# Patient Record
Sex: Female | Born: 1960 | Race: Black or African American | Hispanic: No | Smoking: Never smoker
Health system: Southern US, Community
[De-identification: ages and names within clinical notes are randomized; demographics above are authoritative.]

## PROBLEM LIST (undated history)

## (undated) DIAGNOSIS — C801 Malignant (primary) neoplasm, unspecified: Secondary | ICD-10-CM

---

## 2007-07-29 ENCOUNTER — Ambulatory Visit (HOSPITAL_COMMUNITY): Admission: RE | Admit: 2007-07-29 | Discharge: 2007-07-29 | Payer: Self-pay | Admitting: Obstetrics and Gynecology

## 2007-07-29 ENCOUNTER — Encounter (INDEPENDENT_AMBULATORY_CARE_PROVIDER_SITE_OTHER): Payer: Self-pay | Admitting: Obstetrics and Gynecology

## 2010-08-19 NOTE — H&P (Signed)
Emma Montoya                 ACCOUNT NO.:  [id number]   MEDICAL RECORD NO.:  [id number]          PATIENT TYPE:  AMB   LOCATION:  DAY                          FACILITY:  [facility]   PHYSICIAN:  Crist Fat. Rivard, M.D. DATE OF BIRTH:  16-Mar-1961   DATE OF ADMISSION:  DATE OF DISCHARGE:                              HISTORY & PHYSICAL   HISTORY OF PRESENT ILLNESS:  [name] is a 50 year old divorced  African American female, para 0-0-1-0, who presents for a robot assisted  supracervical hysterectomy because of dysfunctional uterine bleeding and  symptomatic uterine fibroids.  For the past 5 years, the patient has  complained of heavy vaginal bleeding with symptoms worsening over the  past 6 months.  The patient gives the account of a 14-day menstrual flow  with moderate size clotting that require her to change a tampon with a  pad every 2 hours.  The patient originally was placed on Seasonique,  which decreased the frequency of her menses to every 90 days, however  the length of her flow remained the same.  She was then tried on Femcon  oral contraceptives, which did shorten the length of her flow to 7 days.  However, the amount of her flow remained the same.   She admits to nocturia, dyspareunia, but denies changes in her bowel  movements, dysuria, vaginitis symptoms or cramping.   A pelvic ultrasound in March 2009 showed a uterus measuring 10.18 x 8.89  x 7.08 cm with 6 fibroids which are as follows:  Anterior pedunculated  4.24 cm, fundal 3.81 cm, anterior 1.93 cm, two posterior fibroids 3.37  cm and 3.18 cm respectively, and an anterior submucosal fibroid  measuring 2.11 cm.  Additionally a hyperechoic mass was observed within  the endometrium measuring 1.46 x 1.12 cm.  The patient's ovaries  appeared within normal range on this study.  An endometrial biopsy  revealed an inactive endometrium with no hyperplasia or atypia.   After review of both medical and surgical management  options was given  to the patient, she has decided to proceed with definitive therapy due  to the chronicity and disruptive nature of her symptoms.  The patient  has consented for a supracervical hysterectomy.   OB HISTORY:  Gravida 1, para 0-0-1-0.   GYN HISTORY:  Menarche 50 years old.  The patient's last menstrual  period was June 27, 2007.  She denies any history of sexually  transmitted diseases or abnormal Pap smears.  Her last normal Pap smear  was January 2009, as was her last normal mammogram.  The patient uses  bilateral tubal ligation as her method of contraception.   MEDICAL HISTORY:  Positive for anemia.   SURGICAL HISTORY:  In 2003, bilateral tubal ligation.  Denies any  history of blood transfusions or problems with anesthesia.   FAMILY HISTORY:  Cardiovascular disease, thyroid disease, hypertension,  diabetes mellitus and stroke.   SOCIAL HISTORY:  The patient is divorced, and she works for [employer].  Habits:  She does not use tobacco.  She occasionally consumes alcohol.  Denies any illicit drug use.  CURRENT MEDICATIONS:  Femcon, 1 tablet daily, iron 1 tablet daily.   ALLERGIES:  She has no known drug allergies.   REVIEW OF SYSTEMS:  The patient does not wear any corrective lenses.  She does have a right upper jaw partial plate.  Denies any chest pain,  shortness of breath, fever, weight loss, headache, vision changes,  nausea, vomiting, diarrhea, pelvic pain and except as is mentioned in  history of present illness, the patient's review of systems is negative.   PHYSICAL EXAMINATION:  VITAL SIGNS:  Blood pressure 120/80.  Weight is  165.  Height is 5 feet 4 inches tall.  Pulse is 64, temperature 97.7  degrees Fahrenheit orally.  NECK:  Supple without masses.  There is no thyromegaly or cervical  adenopathy.  HEART:  Regular rate and rhythm.  LUNGS:  Clear.  BACK:  No CVA tenderness.  ABDOMEN:  No tenderness, masses or organomegaly.  EXTREMITIES:  No  clubbing, cyanosis or edema.  PELVIC EXAM:  EDBUS was within normal limits.  Vagina was rugous.  Cervix was nontender without lesions.  Uterus appears 12-14 weeks' size  with a palpable posterior fibroid approximately 4 cm.  It was mobile.  There was no tenderness.  Adnexa without tenderness or masses.  Rectovaginal without any tenderness or masses.   IMPRESSION:  1. Dysfunctional uterine bleeding.  2. Symptomatic uterine fibroids.   DISPOSITION:  A discussion was held with the patient regarding the  indications for her procedure along with its risks which include but are  not limited to reaction to anesthesia, damage to adjacent organs,  infection, excessive bleeding, the possibility of spotting due to the  retention of her cervix, and the possibility of growth of new fibroids  in her cervical region (though this is rare).  The patient further  understands that the length of her procedure is longer than it would be  through a traditional open abdominal incision, that she is expected to  be in the hospital for 1-2 days, that she should be expected to return  to her usual activities within 2-3 weeks.  The patient has verbalized  understanding of all of these risks and considerations and has consented  to proceed with a supracervical robot assisted hysterectomy at [facility] on July 29, 2007 at 7:30 a.m.      Emma Montoya.      Crist Fat Rivard, M.D.  Electronically Signed    EJP/MEDQ  D:  07/20/2007  T:  07/20/2007  Job:  [id number]

## 2010-08-19 NOTE — Op Note (Signed)
NAMEROSALIA, MCAVOY                 ACCOUNT NO.:  [id number]   MEDICAL RECORD NO.:  [id number]          PATIENT TYPE:  AMB   LOCATION:  DAY                          FACILITY:  [facility]   PHYSICIAN:  Crist Fat. Rivard, M.D. DATE OF BIRTH:  29-Jul-1960   DATE OF PROCEDURE:  07/29/2007  DATE OF DISCHARGE:                               OPERATIVE REPORT   PREOPERATIVE DIAGNOSIS:  Dysfunctional uterine bleeding with symptomatic  uterine fibroids.   POSTOPERATIVE DIAGNOSIS:  Dysfunctional uterine bleeding with  symptomatic uterine fibroids.   ANESTHESIA:  General, Dr. Okey Dupre.   PROCEDURE:  Robotic assisted supracervical hysterectomy.   SURGEON:  Crist Fat. Rivard, M.D.   ASSISTANTMarquis Lunch. Lowell Guitar, physician's assistant.   ESTIMATED BLOOD LOSS:  100 mL.   DESCRIPTION OF PROCEDURE:  After being informed of the planned procedure  with possible complications including bleeding, infection, injury to  bowel, bladder or ureters, need for laparotomy as well as the  possibility of postoperative bleeding or postoperative growth of a  cervical fibroid, informed consent is obtained.  The patient is taken to  OR #10, given general anesthesia with endotracheal intubation without  complication.  She is placed in the lithotomy position on the sticky  mattress with knee-high sequential compressive devices, arms padded and  tucked on each side and shoulder restraints were placed half an inch  above the shoulders to prevent movement and those shoulder restraints  were double padded.  Her chest is then taped to the table.   She is prepped and draped in a sterile fashion and a Foley catheter is  inserted in her bladder.  Pelvic exam reveals an enlarged uterus  approximately 14 weeks in size with a dominant posterior fibroid,  mobile.  A weighted speculum is inserted, anterior lip of the cervix is  grasped with a tenaculum forceps and the uterus is sounded at 11.5 cm.  The cervix is easily dilated using  Hegar dilator until a #10 RUMI  intrauterine manipulator can be inserted easily.  This RUMI intrauterine  manipulator is also fitted with a 3.5 Coring and a vaginal occluder.  The balloon is inflated with 5 mL of saline and the Coring is sutured to  the cervix with 0 Vicryl.   We then proceed with measuring our trocar placement above the fundus of  the uterus and infiltrate the supraumbilical area with 5 mL of Marcaine  0.25.  A 1 cm vertical incision is then performed and brought down  bluntly to the fascia which is identified and grasped with Kocher  forceps and incised with Mayo scissors.  The peritoneum is then entered  bluntly.  A pursestring suture of 0 Vicryl is placed on the fascia and a  10 mm Hasson trocar is inserted easily in the abdominal cavity.  This  trocar is held in place with a pursestring suture.  It now allows [place] to  insufflate a pneumoperitoneum with CO2 at a maximum pressure of 15 mmHg  and we insert the camera to do a first evaluation of the pelvis.  We see  a normal anterior and  posterior cul-de-sac, a bulky uterus with multiple  fibroids predominantly posterior, but very mobile.  Both ovaries are  normal and both tubes have previous tubal ligation with placement of  four Filshie clips on the right tube and three Filshie clips on the left  tube.  After completing insufflation, we decide the placement of the  remaining trocars and we placed two 8 mm robotic trocars on the left  side under direct visualization after infiltrating each side with  Marcaine 0.25 and we place one 8 mm robotic trocar on the right side in  the same fashion.  We finally place a 10 mm patient side assistant  trocar on the right side after infiltrating with Marcaine 0.25.  The  robot is then advanced in its dock and docking is completed at 8:55 a.m.   Using a Cobra grasper in arm #3, a gyrus forceps in arm #2 and a  monopolar sharp scissor in arm #1 we then proceed with our planned   supracervical hysterectomy.   Starting on the right side we decide to excise the four Filshie clips  using bipolar cauterization and monopolar resection.  We remove this  segment of the tube with the Filshie clips attached and this is removed  via the 10 mm trocar.  Now we have access to the tubo-ovarian ligament  on the right side which is cauterized and sectioned, the round ligament  which is cauterized and sectioned and this gives [place] easy entry to the  broad ligament anteriorly and posteriorly.  Anteriorly this is sharply  dissected and the bladder is completely dissected away easily from the  vaginal fornix identified with the Coring.  We can then skeletonize the  uterine vessels and identify them in the ascending branch near the  Coring junction.  These vessels are then cauterized with bipolar  cauterization.   We proceed in the same fashion on the left side and again we proceed  with excision of the three Filshie clips with cauterization and excision  and removal of the specimen through the 10 mm trocar.  The utero-ovarian  ligament is cauterized and sectioned, the round ligament is cauterized  and sectioned and the broad ligament is sharply entered finishing  completing our dissection of the bladder and skeletonizing the uterine  vessels on the left.   These are then grasped in the ascending branch near the junction of the  Coring and cauterized.  Of course during cauterization of uterine  vessels we can easily identify both ureters which are away from our site  of cauterization.  Now with the posterior Coring in view, we decide to  proceed with the supracervical hysterectomy.  Below the lower uterine  segment we used the monopolar scissors and start our posterior approach  to the cervix.  This is done in a circumferential way easily identifying  the RUMI intrauterine manipulator.  The uterus is completely detached  from its cervical stump.  Hemostasis is completed with  cauterization.  We then irrigate profusely to note a satisfactory hemostasis as well as  two normal ureters with no dilatation and good peristalsis.  The robot  is undocked and the robotic instruments are removed.  We then remove the  10 mm patient side assistant trocar, extend our incision to a 15 mm  incision and insert under direct visualization the morcellator.  We then  proceed with systematic morcellation of the specimen which takes  approximately 45 minutes.  We again turn our attention to the cervical  stump and to all our pedicles which reveal a satisfactory hemostasis.  We moved to a vaginal time to remove the vaginal tenaculum, cervical  tenaculum.  The section the previously placed sutures on the Coring.  We  deflate the RUMI intrauterine manipulator and we remove everything en  bloc.  An oozing site on the anterior lip of the cervix is controlled  with figure-of-eight stitch of 0 Vicryl.  We can now return to  laparoscopy time and using both monopolar and bipolar energy we  cauterize systematically the endocervical canal to avoid postoperative  bleeding.   We irrigate profusely and notice satisfactory hemostasis.  All trocars  are then removed under direct visualization after evacuating the  pneumoperitoneum.   The fascia of the supraumbilical incision is closed with the previously  placed pursestring suture of 0 Vicryl.  We then close the fascia of the  15 mm trocar with a figure-of-eight stitch of 0 Vicryl and all incision  skin is then closed with subcuticular suture of 3-0 Monocryl and  Dermabond.   Instrument and sponge count is complete x2.  Estimated blood loss is 100  mL.  The procedure is very well tolerated by the patient who is taken to  recovery room in a well and stable condition.   SPECIMEN:  Uterine body weighing 278 grams was sent to pathology as well  as seven Filshie clips on two tubal segments.      Crist Fat Rivard, M.D.  Electronically  Signed     SAR/MEDQ  D:  07/29/2007  T:  07/29/2007  Job:  [id number]

## 2010-12-30 LAB — PREGNANCY, URINE: Preg Test, Ur: NEGATIVE

## 2010-12-30 LAB — BASIC METABOLIC PANEL
GFR calc Af Amer: >60
GFR calc non Af Amer: >60
Potassium: 4.1
Sodium: 135

## 2010-12-30 LAB — CBC
HCT: 39.4
Hemoglobin: 13.2
RBC: 4.38
WBC: 9.4

## 2011-06-25 ENCOUNTER — Ambulatory Visit (INDEPENDENT_AMBULATORY_CARE_PROVIDER_SITE_OTHER): Payer: PRIVATE HEALTH INSURANCE | Admitting: Obstetrics and Gynecology

## 2011-06-25 DIAGNOSIS — Z01419 Encounter for gynecological examination (general) (routine) without abnormal findings: Secondary | ICD-10-CM

## 2018-10-12 ENCOUNTER — Other Ambulatory Visit: Payer: Self-pay | Admitting: Registered Nurse

## 2018-10-12 DIAGNOSIS — N6310 Unspecified lump in the right breast, unspecified quadrant: Secondary | ICD-10-CM

## 2018-10-19 ENCOUNTER — Ambulatory Visit
Admission: RE | Admit: 2018-10-19 | Discharge: 2018-10-19 | Disposition: A | Discharge status: Home / Self Care | Payer: BLUE CROSS/BLUE SHIELD | Source: Ambulatory Visit | Attending: Registered Nurse | Admitting: Registered Nurse

## 2018-10-19 ENCOUNTER — Other Ambulatory Visit: Payer: Self-pay

## 2018-10-19 DIAGNOSIS — N6310 Unspecified lump in the right breast, unspecified quadrant: Secondary | ICD-10-CM

## 2018-10-26 ENCOUNTER — Encounter: Payer: Self-pay | Admitting: Adult Health

## 2018-10-26 DIAGNOSIS — Z170 Estrogen receptor positive status [ER+]: Secondary | ICD-10-CM | POA: Insufficient documentation

## 2018-10-26 DIAGNOSIS — C50411 Malignant neoplasm of upper-outer quadrant of right female breast: Secondary | ICD-10-CM | POA: Insufficient documentation

## 2018-10-27 ENCOUNTER — Ambulatory Visit
Admission: RE | Admit: 2018-10-27 | Discharge: 2018-10-27 | Disposition: A | Discharge status: Home / Self Care | Payer: BLUE CROSS/BLUE SHIELD | Source: Ambulatory Visit | Attending: Surgery | Admitting: Surgery

## 2018-10-27 ENCOUNTER — Other Ambulatory Visit: Payer: Self-pay | Admitting: Surgery

## 2018-10-27 ENCOUNTER — Other Ambulatory Visit: Payer: Self-pay

## 2018-10-27 ENCOUNTER — Other Ambulatory Visit (HOSPITAL_COMMUNITY): Payer: Self-pay | Admitting: Surgery

## 2018-10-27 DIAGNOSIS — C50911 Malignant neoplasm of unspecified site of right female breast: Secondary | ICD-10-CM

## 2018-10-28 ENCOUNTER — Other Ambulatory Visit (HOSPITAL_COMMUNITY): Payer: Self-pay | Admitting: Surgery

## 2018-10-28 DIAGNOSIS — C50911 Malignant neoplasm of unspecified site of right female breast: Secondary | ICD-10-CM

## 2018-10-28 DIAGNOSIS — Z170 Estrogen receptor positive status [ER+]: Secondary | ICD-10-CM

## 2018-10-31 ENCOUNTER — Encounter: Payer: Self-pay | Admitting: *Deleted

## 2018-11-21 ENCOUNTER — Encounter (HOSPITAL_COMMUNITY)
Admission: RE | Admit: 2018-11-21 | Discharge: 2018-11-21 | Disposition: A | Discharge status: Home / Self Care | Payer: BLUE CROSS/BLUE SHIELD | Source: Ambulatory Visit | Attending: Surgery | Admitting: Surgery

## 2018-11-21 ENCOUNTER — Encounter (HOSPITAL_COMMUNITY): Payer: Self-pay

## 2018-11-21 ENCOUNTER — Other Ambulatory Visit: Payer: Self-pay

## 2018-11-21 DIAGNOSIS — Z01812 Encounter for preprocedural laboratory examination: Secondary | ICD-10-CM | POA: Insufficient documentation

## 2018-11-21 HISTORY — DX: Malignant (primary) neoplasm, unspecified: C80.1

## 2018-11-21 LAB — CBC
HCT: 38.5 % (ref 36.0–46.0)
Hemoglobin: 12.7 g/dL (ref 12.0–15.0)
MCH: 29.4 pg (ref 26.0–34.0)
MCHC: 33 g/dL (ref 30.0–36.0)
MCV: 89.1 fL (ref 80.0–100.0)
Platelets: 219 10*3/uL (ref 150–400)
RBC: 4.32 MIL/uL (ref 3.87–5.11)
RDW: 12.2 % (ref 11.5–15.5)
WBC: 7.7 10*3/uL (ref 4.0–10.5)
nRBC: 0 % (ref 0.0–0.2)

## 2018-11-21 NOTE — Progress Notes (Addendum)
PCP - NO PCP, Uses her GYN as PCP-Sandra Rivard, MD Cardiologist - pt denies  Chest x-ray - pt denies EKG - pt denies  Stress Test - pt denies ECHO - pt denies  Cardiac Cath - pt denies  Sleep Study - n/a CPAP - n/a  Fasting Blood Sugar - n/a Checks Blood Sugar _____ times a day-n/a  Blood Thinner Instructions: n/a Aspirin Instructions:n/a  Anesthesia review: No  Patient denies shortness of breath, fever, cough and chest pain at PAT appointment  Patient verbalized understanding of instructions that were given to them at the PAT appointment. Patient was also instructed that they will need to review over the PAT instructions again at home before surgery.   Coronavirus Screening  Have you experienced the following symptoms:  Cough yes/no: No Fever (>100.40F)  yes/no: No Runny nose yes/no: No Sore throat yes/no: No Difficulty breathing/shortness of breath  yes/no: No  Have you or a family member traveled in the last 14 days and where? yes/no: No   If the patient indicates "YES" to the above questions, their PAT will be rescheduled to limit the exposure to others and, the surgeon will be notified. THE PATIENT WILL NEED TO BE ASYMPTOMATIC FOR 14 DAYS.   If the patient is not experiencing any of these symptoms, the PAT nurse will instruct them to NOT bring anyone with them to their appointment since they may have these symptoms or traveled as well.   Please remind your patients and families that hospital visitation restrictions are in effect and the importance of the restrictions.

## 2018-11-21 NOTE — Pre-Procedure Instructions (Signed)
Emma Montoya  11/21/2018      [id number] [place], [place] - [address] [place] [address] Phone: [phone] Fax: [phone]    Your procedure is scheduled on Friday November 25, 2018.  Report to [facility] Admitting at 1230 PM.  Call this number if you have problems the morning of surgery:  [phone]   Remember:  Do not eat after midnight (Thursday).  You may drink clear liquids until 1130 AM the morning of surgery.  Clear liquids allowed are:  Water, Juice (non-citric and without pulp), Clear Tea, Black Coffee only and Gatorade    Take these medicines the morning of surgery with A SIP OF WATER -None  Beginning now, STOP taking any Aspirin (unless otherwise instructed by your surgeon), Aleve, Naproxen, Ibuprofen, Motrin, Advil, Goody's, BC's, all herbal medications, fish oil, and all vitamins   Day of surgery:  Do not wear jewelry, make-up or nail polish.  Do not wear lotions, powders, or perfumes, or deodorant.  Do not shave 48 hours prior to surgery.    Do not bring valuables to the hospital.  [facility] is not responsible for any belongings or valuables.  IF you are a smoker, DO NOT Smoke 24 hours prior to surgery   IF you wear a CPAP at night please bring your mask, tubing, and machine the morning of surgery    Remember that you must have someone to transport you home after your surgery, and remain with you for 24 hours if you are discharged the same day. Contacts, dentures or bridgework may not be worn into surgery.  Leave your suitcase in the car.  After surgery it may be brought to your room.  For patients admitted to the hospital, discharge time will be determined by your treatment team.  Patients discharged the day of surgery will not be allowed to drive home.    [facility]- Preparing For Surgery  Before surgery, you can play an important role. Because skin is not sterile, your skin needs  to be as free of germs as possible. You can reduce the number of germs on your skin by washing with CHG (chlorahexidine gluconate) Soap before surgery.  CHG is an antiseptic cleaner which kills germs and bonds with the skin to continue killing germs even after washing.    Oral Hygiene is also important to reduce your risk of infection.  Remember - BRUSH YOUR TEETH THE MORNING OF SURGERY WITH YOUR REGULAR TOOTHPASTE  Please do not use if you have an allergy to CHG or antibacterial soaps. If your skin becomes reddened/irritated stop using the CHG.  Do not shave (including legs and underarms) for at least 48 hours prior to first CHG shower. It is OK to shave your face.  Please follow these instructions carefully.   1. Shower the NIGHT BEFORE SURGERY and the MORNING OF SURGERY with CHG.   2. If you chose to wash your hair, wash your hair first as usual with your normal shampoo.  3. After you shampoo, rinse your hair and body thoroughly to remove the shampoo.  4. Use CHG as you would any other liquid soap. You can apply CHG directly to the skin and wash gently with a scrungie or a clean washcloth.   5. Apply the CHG Soap to your body ONLY FROM THE NECK DOWN.  Do not use on open wounds or open sores. Avoid contact with your eyes, ears,  mouth and genitals (private parts). Wash Face and genitals (private parts)  with your normal soap.  6. Wash thoroughly, paying special attention to the area where your surgery will be performed.  7. Thoroughly rinse your body with warm water from the neck down.  8. DO NOT shower/wash with your normal soap after using and rinsing off the CHG Soap.  9. Pat yourself dry with a CLEAN TOWEL.  10. Wear CLEAN PAJAMAS to bed the night before surgery, wear comfortable clothes the morning of surgery  11. Place CLEAN SHEETS on your bed the night of your first shower and DO NOT SLEEP WITH PETS.  Day of Surgery: Shower as above Do not apply any deodorants/lotions.   Please wear clean clothes to the hospital/surgery center.   Remember to brush your teeth WITH YOUR REGULAR TOOTHPASTE.   Please read over the following fact sheets that you were given.

## 2018-11-22 ENCOUNTER — Other Ambulatory Visit (HOSPITAL_COMMUNITY)
Admission: RE | Admit: 2018-11-22 | Discharge: 2018-11-22 | Disposition: A | Discharge status: Home / Self Care | Payer: BLUE CROSS/BLUE SHIELD | Source: Ambulatory Visit | Attending: Surgery | Admitting: Surgery

## 2018-11-22 DIAGNOSIS — Z20828 Contact with and (suspected) exposure to other viral communicable diseases: Secondary | ICD-10-CM | POA: Insufficient documentation

## 2018-11-22 DIAGNOSIS — Z01812 Encounter for preprocedural laboratory examination: Secondary | ICD-10-CM | POA: Diagnosis not present

## 2018-11-22 LAB — SARS CORONAVIRUS 2 (TAT 6-24 HRS): SARS Coronavirus 2: NEGATIVE

## 2018-11-24 ENCOUNTER — Ambulatory Visit
Admission: RE | Admit: 2018-11-24 | Discharge: 2018-11-24 | Disposition: A | Discharge status: Home / Self Care | Payer: BLUE CROSS/BLUE SHIELD | Source: Ambulatory Visit | Attending: Surgery | Admitting: Surgery

## 2018-11-24 DIAGNOSIS — C50911 Malignant neoplasm of unspecified site of right female breast: Secondary | ICD-10-CM

## 2018-11-24 DIAGNOSIS — Z170 Estrogen receptor positive status [ER+]: Secondary | ICD-10-CM

## 2018-11-24 NOTE — H&P (Signed)
Emma Montoya  Location: [facility] Patient #: [id number] DOB: Aug 20, 1960 Married / Language: English / Race: Black or African American Female  History of Present Illness   The patient is a 58 year old female who presents with a complaint of breast cancer.  The PCP is Marquis Buggy, NP, [place], [place]  The patient was referred by Dr. Lillia Mountain  She comes with her husband Iona Beard.  [The Covid-19 virus has disrupted normal medical care in [place] and across the nation. We have sometimes had to alter normal surgical/medical care to limit this epidemic and we have explained these changes to the patient.]  Her last mammogram was 2018. She had a mammogram at [facility] on 10/06/2018 which showed a 1.4 x 1.2 cm mass at 9 o'clock in the left breast. She did not feel anything abnormal in her breast. She's had no prior breast disease. She does not have a PCP. She saw Dr. Colletta Maryland at her Gyn, but it sounds like she has retired.  Mammograms: Her mammogram was done in [place]. They did not comment on the axillary nodes Biopsy: Right breast biopsy - 10/19/2018 - [id number] - IDC with mucin, grade 1, ER - 100%, PR - 100%, Ki67 - 5% Family history of breast or ovarian cancer: No On hormone therapy: No  I discussed the options for breast cancer treatment with the patient. I discussed the multidisciplinary approach to breast cacner which includes medical oncology and radiation oncology. I discussed the surgical options of lumpectomy vs. mastectomy. If mastectomy, there is the possibility of reconstruction. I discussed the options of lymph node biopsy. The treatment plan depends on the pathologic staging of the tumor and the patient's personal wishes. The risks of surgery include, but are not limited to, bleeding, infection, the need for further surgery, and nerve injury. The patient has been given  literature on the treatment of breast cancer. She is a good candidate for a right breast lumpectomy.  Plan: 1) [place] of right axilla (she's going to get this today), 2) Right breast lumpectomy (seed localization) and right axillary SLNBx  Past Medical History: 1. Supracervical hysterectomy - 08/07/2010 - [facility] 2. No significant medical condition  Social History: Married, Iona Beard. No children. 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 Medications Reconciled  Social History Nance Pew, CMA; 10/27/2018 8:31 AM) Alcohol use  Occasional alcohol use. Caffeine use  Carbonated beverages, Coffee, Tea. No drug use  Tobacco use  Never smoker.  Family History Nance Pew, [place]; 10/27/2018 8:31 AM) Cerebrovascular Accident  Mother. Colon Cancer  Sister. Diabetes Mellitus  Mother. Hypertension  Mother. Thyroid problems  Mother.  Pregnancy / Birth History Nance Pew, [place]; 10/27/2018 8:31 AM) Age at menarche  74 years. Age of menopause  60-50 Maternal age  40-20 Para  0  Other Problems Nance Pew, CMA; 10/27/2018 8:32 AM) Breast Cancer  Lump In Breast   Vitals (Sabrina Canty CMA; 10/27/2018  8:33 AM) 10/27/2018 8:32 AM Weight: 175.6 lb Height: 62in Body Surface Area: 1.81 m Body Mass Index: 32.12 kg/m  Temp.: 98.2F (Oral)  Pulse: 100 (Regular)  BP: 148/72(Sitting, Left Arm, Standard)   Physical Exam General: WN AA [place] and generally healthy appearing. She is wearing a mask. HEENT: Normal. Pupils equal.  Neck: Supple. No mass. No thyroid mass.  Lymph Nodes: No supraclavicular or cervical nodes.  Lungs: Clear to auscultation and symmetric breath sounds. Heart: RRR. No murmur or rub.  Breasts: Right - She has a puncture wound at 7 o'clock. I do not fee a mass in her right breast. Left - No mass or nodule.  Abdomen: Soft. No mass. No tenderness. No hernia. Normal bowel sounds. No abdominal scars. Rectal: Not done.  Extremities: Good strength and ROM in upper and lower extremities.  Neurologic: Grossly intact to motor and sensory function. Psychiatric: Has normal mood and affect. Behavior is normal.  Assessment & Plan  1.  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 Herold Salguero MD; 10/27/2018 1:33 PM)   [place] of axilla was negative   Will proceed with surgery  2) Right breast lumpectomy (seed localization) and right axillary SLNBx  2. Supracervical hysterectomy - 08/07/2010 - [facility]   Alphonsa Overall, MD, [facility] Pager: [phone] Office phone:  [phone]

## 2018-11-25 ENCOUNTER — Other Ambulatory Visit: Payer: Self-pay

## 2018-11-25 ENCOUNTER — Ambulatory Visit (HOSPITAL_COMMUNITY)
Admission: RE | Admit: 2018-11-25 | Discharge: 2018-11-25 | Disposition: A | Discharge status: Home / Self Care | Payer: BLUE CROSS/BLUE SHIELD | Source: Ambulatory Visit | Attending: Surgery | Admitting: Surgery

## 2018-11-25 ENCOUNTER — Encounter (HOSPITAL_COMMUNITY)
Admission: RE | Disposition: A | Discharge status: Home / Self Care | Payer: Self-pay | Source: Home / Self Care | Attending: Surgery

## 2018-11-25 ENCOUNTER — Ambulatory Visit (HOSPITAL_COMMUNITY): Payer: BLUE CROSS/BLUE SHIELD | Admitting: Certified Registered Nurse Anesthetist

## 2018-11-25 ENCOUNTER — Encounter (HOSPITAL_COMMUNITY): Payer: Self-pay | Admitting: *Deleted

## 2018-11-25 ENCOUNTER — Ambulatory Visit
Admission: RE | Admit: 2018-11-25 | Discharge: 2018-11-25 | Disposition: A | Discharge status: Home / Self Care | Payer: BLUE CROSS/BLUE SHIELD | Source: Ambulatory Visit | Attending: Surgery | Admitting: Surgery

## 2018-11-25 ENCOUNTER — Ambulatory Visit (HOSPITAL_COMMUNITY)
Admission: RE | Admit: 2018-11-25 | Discharge: 2018-11-25 | Disposition: A | Discharge status: Home / Self Care | Payer: BLUE CROSS/BLUE SHIELD | Attending: Surgery | Admitting: Surgery

## 2018-11-25 DIAGNOSIS — C50911 Malignant neoplasm of unspecified site of right female breast: Secondary | ICD-10-CM

## 2018-11-25 DIAGNOSIS — Z170 Estrogen receptor positive status [ER+]: Secondary | ICD-10-CM | POA: Insufficient documentation

## 2018-11-25 DIAGNOSIS — C50811 Malignant neoplasm of overlapping sites of right female breast: Secondary | ICD-10-CM | POA: Diagnosis present

## 2018-11-25 DIAGNOSIS — C50511 Malignant neoplasm of lower-outer quadrant of right female breast: Secondary | ICD-10-CM | POA: Insufficient documentation

## 2018-11-25 HISTORY — PX: BREAST LUMPECTOMY WITH RADIOACTIVE SEED AND SENTINEL LYMPH NODE BIOPSY: SHX6550

## 2018-11-25 SURGERY — BREAST LUMPECTOMY WITH RADIOACTIVE SEED AND SENTINEL LYMPH NODE BIOPSY
Anesthesia: General | Site: Breast | Laterality: Right

## 2018-11-25 MED ORDER — ONDANSETRON HCL 4 MG/2ML IJ SOLN
4.0000 mg | Freq: Once | INTRAMUSCULAR | Status: AC | PRN
  Administered 2018-11-25: 4 mg via INTRAVENOUS

## 2018-11-25 MED ORDER — OXYCODONE HCL 5 MG PO TABS: 5.0000 mg | ORAL_TABLET | Freq: Once | ORAL | Status: DC | PRN

## 2018-11-25 MED ORDER — LACTATED RINGERS IV SOLN
INTRAVENOUS | Status: DC
  Administered 2018-11-25: 13:00:00 via INTRAVENOUS

## 2018-11-25 MED ORDER — LIDOCAINE 2% (20 MG/ML) 5 ML SYRINGE
INTRAMUSCULAR | Status: DC | PRN
  Administered 2018-11-25: 100 mg via INTRAVENOUS

## 2018-11-25 MED ORDER — DEXAMETHASONE SODIUM PHOSPHATE 10 MG/ML IJ SOLN
INTRAMUSCULAR | Status: AC
  Filled 2018-11-25: qty 1

## 2018-11-25 MED ORDER — 0.9 % SODIUM CHLORIDE (POUR BTL) OPTIME
TOPICAL | Status: DC | PRN
  Administered 2018-11-25: 1000 mL

## 2018-11-25 MED ORDER — PROPOFOL 10 MG/ML IV BOLUS
INTRAVENOUS | Status: DC | PRN
  Administered 2018-11-25: 170 mg via INTRAVENOUS

## 2018-11-25 MED ORDER — PROPOFOL 10 MG/ML IV BOLUS
INTRAVENOUS | Status: AC
  Filled 2018-11-25: qty 40

## 2018-11-25 MED ORDER — MIDAZOLAM HCL 2 MG/2ML IJ SOLN
1.0000 mg | Freq: Once | INTRAMUSCULAR | Status: AC
  Administered 2018-11-25: 1 mg via INTRAVENOUS

## 2018-11-25 MED ORDER — FENTANYL CITRATE (PF) 100 MCG/2ML IJ SOLN
INTRAMUSCULAR | Status: AC
  Administered 2018-11-25: 50 ug via INTRAVENOUS
  Filled 2018-11-25: qty 2

## 2018-11-25 MED ORDER — CEFAZOLIN SODIUM-DEXTROSE 2-4 GM/100ML-% IV SOLN
INTRAVENOUS | Status: AC
  Filled 2018-11-25: qty 100

## 2018-11-25 MED ORDER — ACETAMINOPHEN 500 MG PO TABS
1000.0000 mg | ORAL_TABLET | ORAL | Status: AC
  Administered 2018-11-25: 13:00:00 1000 mg via ORAL

## 2018-11-25 MED ORDER — FENTANYL CITRATE (PF) 100 MCG/2ML IJ SOLN
INTRAMUSCULAR | Status: DC | PRN
  Administered 2018-11-25 (×4): 50 ug via INTRAVENOUS

## 2018-11-25 MED ORDER — METOCLOPRAMIDE HCL 5 MG/ML IJ SOLN
10.0000 mg | Freq: Four times a day (QID) | INTRAMUSCULAR | Status: DC | PRN
  Administered 2018-11-25: 10 mg via INTRAVENOUS

## 2018-11-25 MED ORDER — ONDANSETRON HCL 4 MG/2ML IJ SOLN
INTRAMUSCULAR | Status: DC | PRN
  Administered 2018-11-25: 4 mg via INTRAVENOUS

## 2018-11-25 MED ORDER — FENTANYL CITRATE (PF) 100 MCG/2ML IJ SOLN
50.0000 ug | Freq: Once | INTRAMUSCULAR | Status: AC
  Administered 2018-11-25: 50 ug via INTRAVENOUS

## 2018-11-25 MED ORDER — CELECOXIB 200 MG PO CAPS
ORAL_CAPSULE | ORAL | Status: AC
  Administered 2018-11-25: 200 mg via ORAL
  Filled 2018-11-25: qty 1

## 2018-11-25 MED ORDER — HYDROCODONE-ACETAMINOPHEN 5-325 MG PO TABS: 1.0000 | ORAL_TABLET | Freq: Four times a day (QID) | ORAL | 0 refills | Status: AC | PRN

## 2018-11-25 MED ORDER — MIDAZOLAM HCL 5 MG/5ML IJ SOLN
INTRAMUSCULAR | Status: DC | PRN
  Administered 2018-11-25: 1 mg via INTRAVENOUS

## 2018-11-25 MED ORDER — METOCLOPRAMIDE HCL 5 MG/ML IJ SOLN
INTRAMUSCULAR | Status: AC
  Filled 2018-11-25: qty 2

## 2018-11-25 MED ORDER — STERILE WATER FOR IRRIGATION IR SOLN
Status: DC | PRN
  Administered 2018-11-25: 1000 mL

## 2018-11-25 MED ORDER — METHYLENE BLUE 0.5 % INJ SOLN
INTRAVENOUS | Status: AC
  Filled 2018-11-25: qty 10

## 2018-11-25 MED ORDER — ACETAMINOPHEN 500 MG PO TABS
ORAL_TABLET | ORAL | Status: AC
  Administered 2018-11-25: 1000 mg via ORAL
  Filled 2018-11-25: qty 2

## 2018-11-25 MED ORDER — CELECOXIB 200 MG PO CAPS
200.0000 mg | ORAL_CAPSULE | ORAL | Status: AC
  Administered 2018-11-25: 13:00:00 200 mg via ORAL

## 2018-11-25 MED ORDER — FENTANYL CITRATE (PF) 250 MCG/5ML IJ SOLN
INTRAMUSCULAR | Status: AC
  Filled 2018-11-25: qty 5

## 2018-11-25 MED ORDER — CEFAZOLIN SODIUM-DEXTROSE 2-4 GM/100ML-% IV SOLN
2.0000 g | INTRAVENOUS | Status: AC
  Administered 2018-11-25: 2 g via INTRAVENOUS

## 2018-11-25 MED ORDER — BUPIVACAINE-EPINEPHRINE (PF) 0.25% -1:200000 IJ SOLN
INTRAMUSCULAR | Status: AC
  Filled 2018-11-25: qty 30

## 2018-11-25 MED ORDER — DEXAMETHASONE SODIUM PHOSPHATE 10 MG/ML IJ SOLN
INTRAMUSCULAR | Status: DC | PRN
  Administered 2018-11-25: 5 mg via INTRAVENOUS

## 2018-11-25 MED ORDER — FENTANYL CITRATE (PF) 100 MCG/2ML IJ SOLN: 25.0000 ug | INTRAMUSCULAR | Status: DC | PRN

## 2018-11-25 MED ORDER — MIDAZOLAM HCL 2 MG/2ML IJ SOLN
INTRAMUSCULAR | Status: AC
  Administered 2018-11-25: 1 mg via INTRAVENOUS
  Filled 2018-11-25: qty 2

## 2018-11-25 MED ORDER — CHLORHEXIDINE GLUCONATE CLOTH 2 % EX PADS: 6.0000 | MEDICATED_PAD | Freq: Once | CUTANEOUS | Status: DC

## 2018-11-25 MED ORDER — OXYCODONE HCL 5 MG/5ML PO SOLN: 5.0000 mg | Freq: Once | ORAL | Status: DC | PRN

## 2018-11-25 MED ORDER — BUPIVACAINE-EPINEPHRINE 0.25% -1:200000 IJ SOLN
INTRAMUSCULAR | Status: DC | PRN
  Administered 2018-11-25: 20 mL

## 2018-11-25 MED ORDER — TECHNETIUM TC 99M SULFUR COLLOID FILTERED
1.0000 | Freq: Once | INTRAVENOUS | Status: AC | PRN
  Administered 2018-11-25: 1 via INTRADERMAL

## 2018-11-25 MED ORDER — ONDANSETRON HCL 4 MG/2ML IJ SOLN
INTRAMUSCULAR | Status: AC
  Filled 2018-11-25: qty 2

## 2018-11-25 MED ORDER — MIDAZOLAM HCL 2 MG/2ML IJ SOLN
INTRAMUSCULAR | Status: AC
  Filled 2018-11-25: qty 2

## 2018-11-25 SURGICAL SUPPLY — 39 items
BINDER BREAST LRG (GAUZE/BANDAGES/DRESSINGS) IMPLANT
BINDER BREAST XLRG (GAUZE/BANDAGES/DRESSINGS) IMPLANT
BINDER BREAST XXLRG (GAUZE/BANDAGES/DRESSINGS) ×3 IMPLANT
CANISTER SUCT 3000ML PPV (MISCELLANEOUS) IMPLANT
CHLORAPREP W/TINT 26 (MISCELLANEOUS) ×3 IMPLANT
CLIP VESOCCLUDE SM WIDE 6/CT (CLIP) ×3 IMPLANT
COVER PROBE W GEL 5X96 (DRAPES) ×3 IMPLANT
COVER SURGICAL LIGHT HANDLE (MISCELLANEOUS) ×3 IMPLANT
COVER WAND RF STERILE (DRAPES) ×3 IMPLANT
DECANTER SPIKE VIAL GLASS SM (MISCELLANEOUS) ×3 IMPLANT
DERMABOND ADVANCED (GAUZE/BANDAGES/DRESSINGS) ×2
DERMABOND ADVANCED .7 DNX12 (GAUZE/BANDAGES/DRESSINGS) ×1 IMPLANT
DEVICE DUBIN SPECIMEN MAMMOGRA (MISCELLANEOUS) ×3 IMPLANT
DRAPE CHEST BREAST 15X10 FENES (DRAPES) ×3 IMPLANT
ELECT COATED BLADE 2.86 ST (ELECTRODE) ×3 IMPLANT
ELECT REM PT RETURN 9FT ADLT (ELECTROSURGICAL) ×3
ELECTRODE REM PT RTRN 9FT ADLT (ELECTROSURGICAL) ×1 IMPLANT
GAUZE SPONGE 4X4 12PLY STRL (GAUZE/BANDAGES/DRESSINGS) ×3 IMPLANT
GLOVE SURG SIGNA 7.5 PF LTX (GLOVE) ×6 IMPLANT
GOWN STRL REUS W/ TWL LRG LVL3 (GOWN DISPOSABLE) ×1 IMPLANT
GOWN STRL REUS W/ TWL XL LVL3 (GOWN DISPOSABLE) ×1 IMPLANT
GOWN STRL REUS W/TWL LRG LVL3 (GOWN DISPOSABLE) ×2
GOWN STRL REUS W/TWL XL LVL3 (GOWN DISPOSABLE) ×2
ILLUMINATOR WAVEGUIDE N/F (MISCELLANEOUS) IMPLANT
KIT BASIN OR (CUSTOM PROCEDURE TRAY) ×3 IMPLANT
KIT MARKER MARGIN INK (KITS) ×3 IMPLANT
LIGHT WAVEGUIDE WIDE FLAT (MISCELLANEOUS) IMPLANT
NEEDLE 18GX1X1/2 (RX/OR ONLY) (NEEDLE) IMPLANT
NEEDLE FILTER BLUNT 18X 1/2SAF (NEEDLE)
NEEDLE FILTER BLUNT 18X1 1/2 (NEEDLE) IMPLANT
NEEDLE HYPO 25GX1X1/2 BEV (NEEDLE) ×3 IMPLANT
NS IRRIG 1000ML POUR BTL (IV SOLUTION) ×3 IMPLANT
PACK GENERAL/GYN (CUSTOM PROCEDURE TRAY) ×3 IMPLANT
PAD ABD 8X10 STRL (GAUZE/BANDAGES/DRESSINGS) ×6 IMPLANT
SUT MNCRL AB 4-0 PS2 18 (SUTURE) ×3 IMPLANT
SUT VIC AB 3-0 SH 8-18 (SUTURE) ×3 IMPLANT
SYR CONTROL 10ML LL (SYRINGE) ×3 IMPLANT
TOWEL GREEN STERILE (TOWEL DISPOSABLE) ×3 IMPLANT
TOWEL GREEN STERILE FF (TOWEL DISPOSABLE) ×3 IMPLANT

## 2018-11-25 NOTE — Anesthesia Procedure Notes (Signed)
Procedure Name: LMA Insertion Date/Time: 11/25/2018 2:38 PM Performed by: Colin Benton, CRNA Pre-anesthesia Checklist: Patient identified, Emergency Drugs available, Suction available and Patient being monitored Patient Re-evaluated:Patient Re-evaluated prior to induction Oxygen Delivery Method: Circle system utilized Preoxygenation: Pre-oxygenation with 100% oxygen Induction Type: IV induction Ventilation: Mask ventilation without difficulty LMA: LMA inserted LMA Size: 4.0 Number of attempts: 1 Placement Confirmation: positive ETCO2 and breath sounds checked- equal and bilateral Tube secured with: Tape Dental Injury: Teeth and Oropharynx as per pre-operative assessment

## 2018-11-25 NOTE — Transfer of Care (Signed)
Immediate Anesthesia Transfer of Care Note  Patient: Emma Montoya  Procedure(s) Performed: RIGHT BREAST LUMPECTOMY WITH RADIOACTIVE SEED AND RIGHT AXILLARY SENTINEL LYMPH NODE BIOPSY (Right Breast)  Patient Location: PACU  Anesthesia Type:GA combined with regional for post-op pain  Level of Consciousness: awake, alert , oriented and patient cooperative  Airway & Oxygen Therapy: Patient Spontanous Breathing and Patient connected to nasal cannula oxygen  Post-op Assessment: Report given to RN, Post -op Vital signs reviewed and stable and Patient moving all extremities X 4  Post vital signs: Reviewed and stable  Last Vitals:  Vitals Value Taken Time  BP 132/99 11/25/18 1615  Temp    Pulse 72 11/25/18 1619  Resp 6 11/25/18 1619  SpO2 100 % 11/25/18 1619  Vitals shown include unvalidated device data.  Last Pain:  Vitals:   11/25/18 1252  TempSrc: Oral  PainSc:       Patients Stated Pain Goal: 3 ([id number] [phone])  Complications: No apparent anesthesia complications

## 2018-11-25 NOTE — Discharge Instructions (Signed)
[facility] - DISCHARGE INSTRUCTIONS TO PATIENT  Activity:  Driving - May drive in 2 to 4 days, if doing well and off pain meds   Lifting - No lifting more than 15 pounds for 7 days                       Practice you Covid-19 protection:  Wear a mask, social distance, and wash your hands frequently  Wound Care:   Leave the incision dry for 2 days, then you may shower  Diet:  As tolerated  Follow up appointment:  Call Dr. Pollie Friar office [facility]) at [phone] for an appointment in 2 to 3 weeks.         We are doing "e" visits post op during this Covid-19 virus epidemic, our office will contact you about this arrangement.  If you have not heard from our office, call our office the day before your scheduled visit to make plans for your visit.  Medications and dosages:  Resume your home medications.  You have a prescription for:  Vicodin  Call Dr. Lucia Gaskins or his office  [phone]) if you have:  Temperature greater than 100.4,  Persistent nausea and vomiting,  Severe uncontrolled pain,  Redness, tenderness, or signs of infection (pain, swelling, redness, odor or green/yellow discharge around the site),  Difficulty breathing, headache or visual disturbances,  Any other questions or concerns you may have after discharge.  In an emergency, call 911 or go to an Emergency Department at a nearby hospital.

## 2018-11-25 NOTE — Anesthesia Procedure Notes (Signed)
Anesthesia Regional Block: Pectoralis block   Pre-Anesthetic Checklist: ,, timeout performed, Correct Patient, Correct Site, Correct Laterality, Correct Procedure, Correct Position, site marked, Risks and benefits discussed,  Surgical consent,  Pre-op evaluation,  At surgeon's request and post-op pain management  Laterality: Right  Prep: chloraprep       Needles:  Injection technique: Single-shot  Needle Type: Echogenic Needle     Needle Length: 9cm  Needle Gauge: 21     Additional Needles:   Procedures:,,,, ultrasound used (permanent image in chart),,,,  Narrative:  Start time: 11/25/2018 1:50 PM End time: 11/25/2018 2:00 PM Injection made incrementally with aspirations every 5 mL.  Performed by: Personally  Anesthesiologist: Roberts Gaudy, MD  Additional Notes: 30 cc 0.25% Bupivacaine injected easily

## 2018-11-25 NOTE — Anesthesia Postprocedure Evaluation (Signed)
Anesthesia Post Note  Patient: Emma Montoya  Procedure(s) Performed: RIGHT BREAST LUMPECTOMY WITH RADIOACTIVE SEED AND RIGHT AXILLARY SENTINEL LYMPH NODE BIOPSY (Right Breast)     Patient location during evaluation: PACU Anesthesia Type: General Level of consciousness: awake and alert Pain management: pain level controlled Vital Signs Assessment: post-procedure vital signs reviewed and stable Respiratory status: spontaneous breathing, nonlabored ventilation, respiratory function stable and patient connected to nasal cannula oxygen Cardiovascular status: blood pressure returned to baseline and stable Postop Assessment: no apparent nausea or vomiting Anesthetic complications: no    Last Vitals:  Vitals:   11/25/18 1630 11/25/18 1645  BP: 127/67 (!) 141/77  Pulse: 62 60  Resp: 16 12  Temp:  36.7 C  SpO2: 95% 99%    Last Pain:  Vitals:   11/25/18 1645  TempSrc:   PainSc: 0-No pain                 Charlette Hennings COKER

## 2018-11-25 NOTE — Op Note (Signed)
11/25/2018  4:11 PM  PATIENT:  Emma Montoya DOB: 1961/02/23 MRN: [id number]  PREOP DIAGNOSIS:   RIGHT BREAST CANCER  POSTOP DIAGNOSIS:    Right breast cancer, 8:30 o'clock position (T1, N0)  PROCEDURE:   Procedure(s):  RIGHT BREAST LUMPECTOMY WITH RADIOACTIVE SEED AND RIGHT AXILLARY SENTINEL LYMPH NODE BIOPSY,  deep sentinel lymph node biopsy  SURGEON:   Alphonsa Overall, M.D.  ANESTHESIA:   General  Anesthesiologist: Roberts Gaudy, MD CRNA: Colin Benton, CRNA  General  EBL:  75  ml  DRAINS:  none   LOCAL MEDICATIONS USED:   20 cc 1/4% marcaine, Right pectoral block  SPECIMEN:   Right breast lumpectomy (6 color paint), right axillary sentinel lymph node (counts 400, background 20)  COUNTS CORRECT:  YES  INDICATIONS FOR PROCEDURE:  Emma Montoya is a 58 y.o. (DOB: [birth year]) AA female whose primary care physician is Patient, No Pcp Per and comes for right  breast lumpectomy and right axillary sentinel lymph node biopsy.   The options for breast cancer treatment have been discussed with the patient. She elected to proceed with lumpectomy and axillary sentinel lymph node.     The indications and potential complications of surgery were explained to the patient. Potential complications include, but are not limited to, bleeding, infection, the need for further surgery, and nerve injury.     She had a I131 seed placed on 8/20 in her right breast at [facility].  The seed is in the 8:30 o'clock position of the right breast.   In the holding area, her right areola was injected with 1 millicurie of Technitium Sulfur Colloid.  OPERATIVE NOTE:   The patient was taken to operating room # 2 at the main [facility] OR where she underwent a general anesthesia  supervised by Anesthesiologist: Roberts Gaudy, MD CRNA: Colin Benton, CRNA. Her right breast and axilla were prepped with  ChloraPrep and sterilely draped.    A time-out and the surgical check list was reviewed.    The  cancer which was about at the 8:30 o'clock position of the right breast.   It was 7 cm from the edge of the areola, so I made a radial incision directly over the tumor.   I used the Neoprobe to identify the I131 seed.  I tried to excise an area around the tumor of at least 1 cm.    I excised this block of breast tissue approximately 3 cm by 4 cm  in diameter.   I painted the lumpectomy specimen with the 6 color paint kit and did a specimen mammogram which confirmed the mass, clip, and the seed were all in the right position in the specimen.  The specimen was sent to pathology who called back to confirm that they have the seed and the specimen.   I then started the right deep axillary sentinel lymph node biopsy. I made an incision in the right axilla.  I found a hot area at the junction of the breast and the pectoralis major muscle, deep in the axilla. I cut down and  identified a hot node that had counts of 400 and the background has 20 counts.  I checked her internal mammary nodes and supraclavicular nodes with the neoprobe and found no other hot area. The axillary node was then sent to pathology.    I then irrigated the wound with saline. I infiltrated approximately 20 mL of 1/4% Marcaine between the incisions. She had had a right pectoral  block.  I placed 4 clips to mark biopsy cavity, at 12, 3, 6, and 9 o'clock.  I then closed all the wounds in layers using 3-0 Vicryl sutures for the deep layer. At the skin, I closed the incisions with a 4-0 Monocryl suture. The incisions were then painted with Dermabond.  She had gauze place over the wounds and placed in a breast binder.   The patient tolerated the procedure well, was transported to the recovery room in good condition. Sponge and needle count were correct at the end of the case.   Final pathology is pending.   Alphonsa Overall, MD, [facility] Pager: [phone] Office phone:  [phone]

## 2018-11-25 NOTE — Interval H&P Note (Signed)
History and Physical Interval Note:  11/25/2018 2:13 PM  [place]  has presented today for surgery, with the diagnosis of RIGHT BREAST CANCER.  The various methods of treatment have been discussed with the patient and family. Seed in place. After consideration of risks, benefits and other options for treatment, the patient has consented to  Procedure(s): RIGHT BREAST LUMPECTOMY WITH RADIOACTIVE SEED AND RIGHT AXILLARY SENTINEL LYMPH NODE BIOPSY (Right) as a surgical intervention.  The patient's history has been reviewed, patient examined, no change in status, stable for surgery.  I have reviewed the patient's chart and labs.  Questions were answered to the patient's satisfaction.     Shann Medal

## 2018-11-25 NOTE — Anesthesia Preprocedure Evaluation (Addendum)
Anesthesia Evaluation  Patient identified by MRN, date of birth, ID band Patient awake    Reviewed: Allergy & Precautions, NPO status , Patient's Chart, lab work & pertinent test results  Airway Mallampati: II  TM Distance: >3 FB Neck ROM: Full    Dental  (+) Teeth Intact, Dental Advisory Given   Pulmonary    breath sounds clear to auscultation       Cardiovascular  Rhythm:Regular Rate:Normal     Neuro/Psych    GI/Hepatic   Endo/Other    Renal/GU      Musculoskeletal   Abdominal   Peds  Hematology   Anesthesia Other Findings   Reproductive/Obstetrics                             Anesthesia Physical Anesthesia Plan  ASA: II  Anesthesia Plan: General   Post-op Pain Management:  Regional for Post-op pain   Induction: Intravenous  PONV Risk Score and Plan: Ondansetron and Dexamethasone  Airway Management Planned: LMA  Additional Equipment:   Intra-op Plan:   Post-operative Plan:   Informed Consent: I have reviewed the patients History and Physical, chart, labs and discussed the procedure including the risks, benefits and alternatives for the proposed anesthesia with the patient or authorized representative who has indicated his/her understanding and acceptance.     Dental advisory given  Plan Discussed with: CRNA and Anesthesiologist  Anesthesia Plan Comments:         Anesthesia Quick Evaluation  

## 2018-11-26 ENCOUNTER — Encounter (HOSPITAL_COMMUNITY): Payer: Self-pay | Admitting: Surgery

## 2018-11-29 ENCOUNTER — Other Ambulatory Visit (HOSPITAL_COMMUNITY): Payer: Self-pay | Admitting: Surgery

## 2018-12-15 ENCOUNTER — Encounter (HOSPITAL_COMMUNITY): Payer: Self-pay

## 2019-03-14 ENCOUNTER — Encounter: Payer: Self-pay | Admitting: *Deleted

## 2020-07-10 IMAGING — MG MM PLC BREAST LOC DEV 1ST LESION INC*R*
6 series · 6 of 6 positions shown · non-contrast
Comparison: Previous exam(s).

CLINICAL DATA: Patient for preoperative localization prior to right
breast lumpectomy.

EXAM:
MAMMOGRAPHIC GUIDED RADIOACTIVE SEED LOCALIZATION OF THE RIGHT
BREAST

[R LM (1 of 3)]
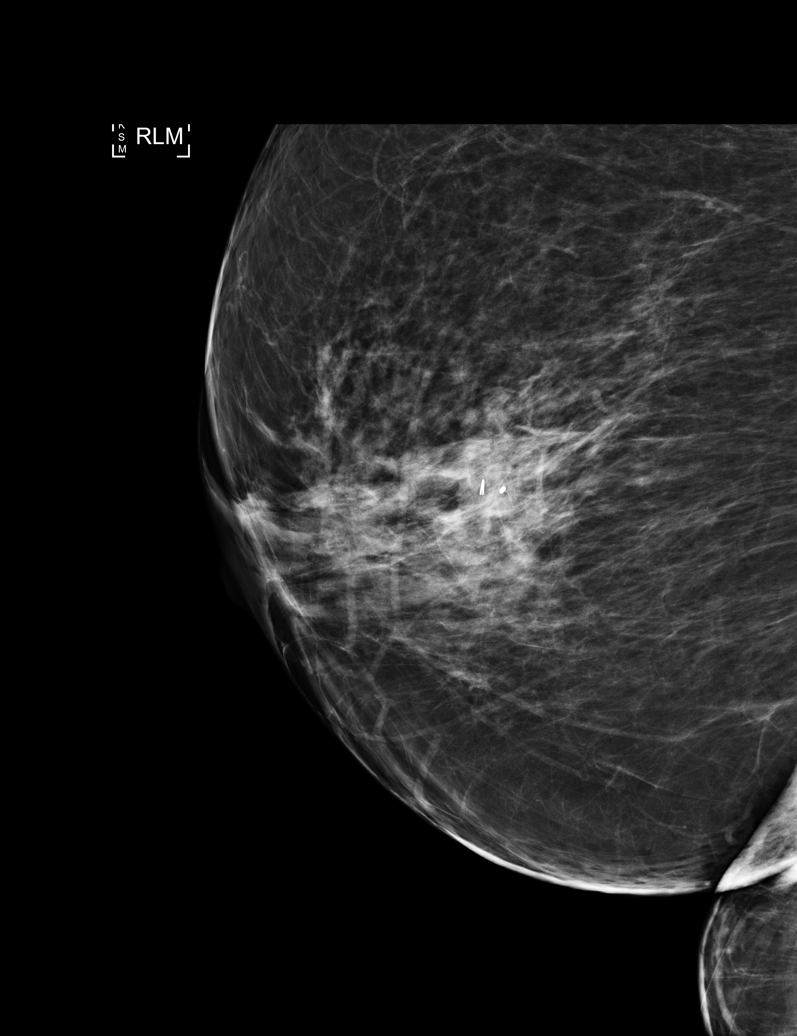

[R LM (2 of 3)]
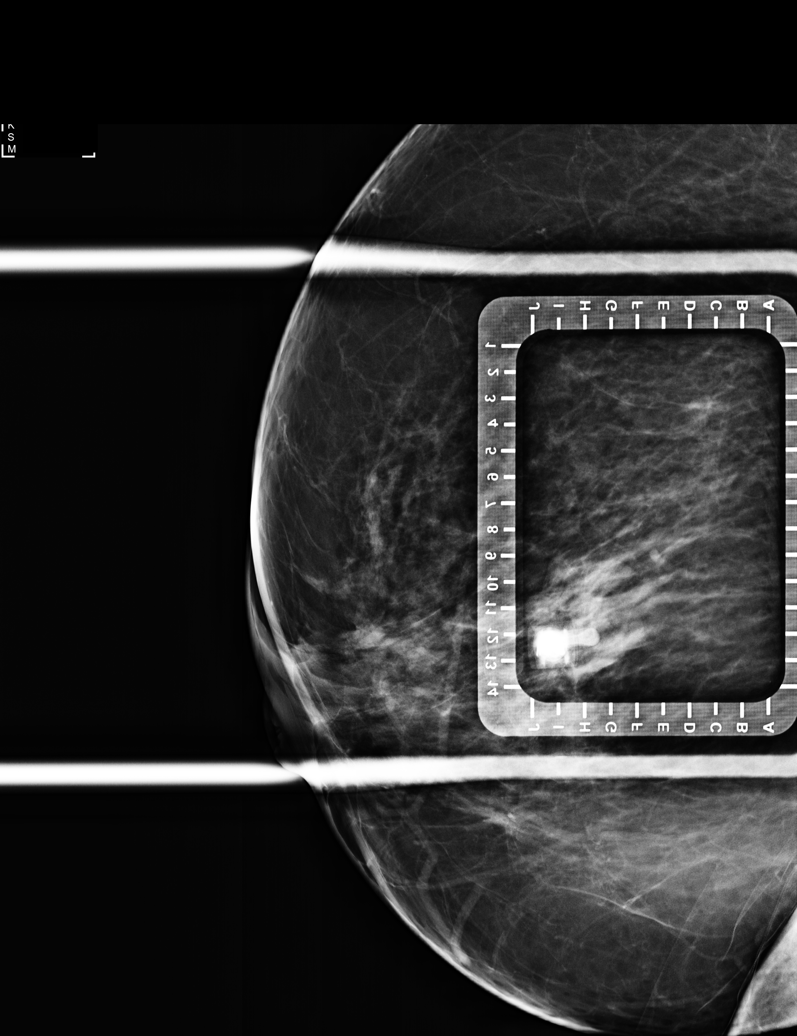

[R CC (1 of 3)]
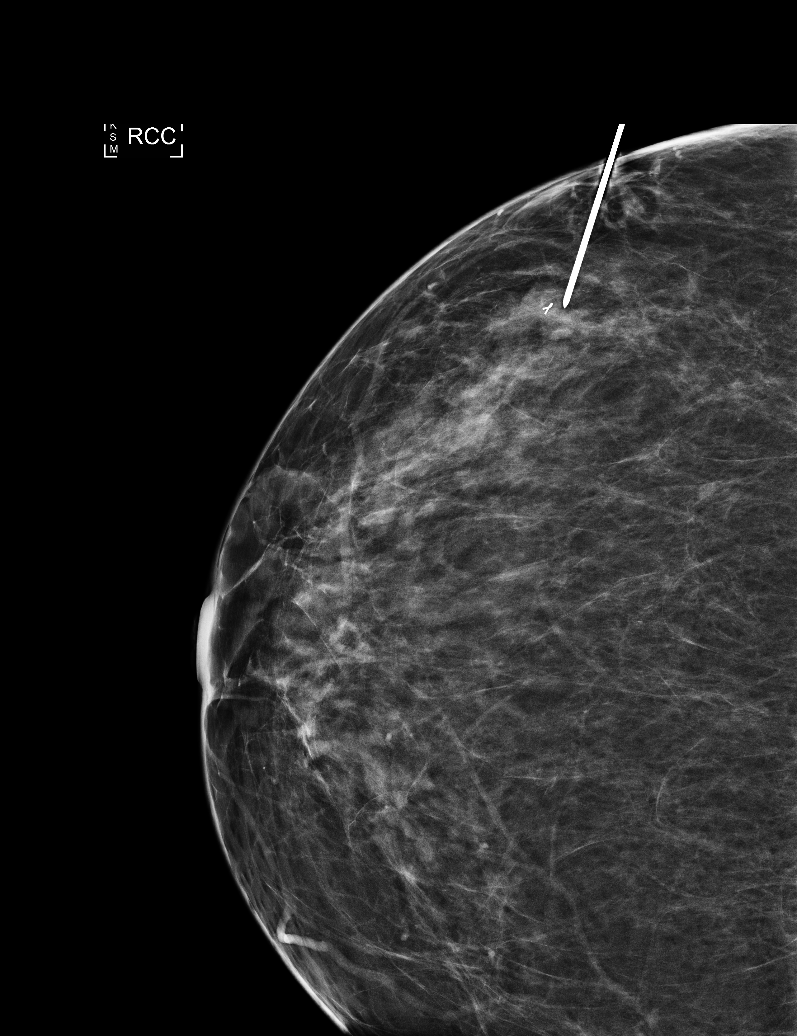

[R CC (2 of 3)]
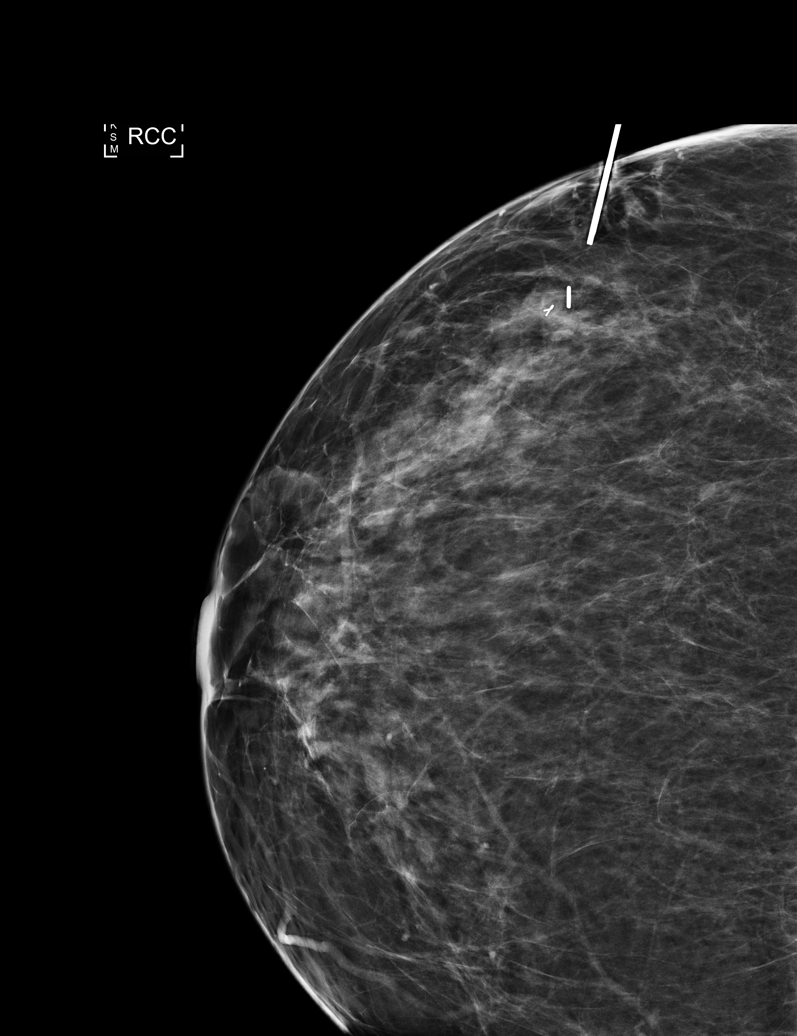

[R LM (3 of 3)]
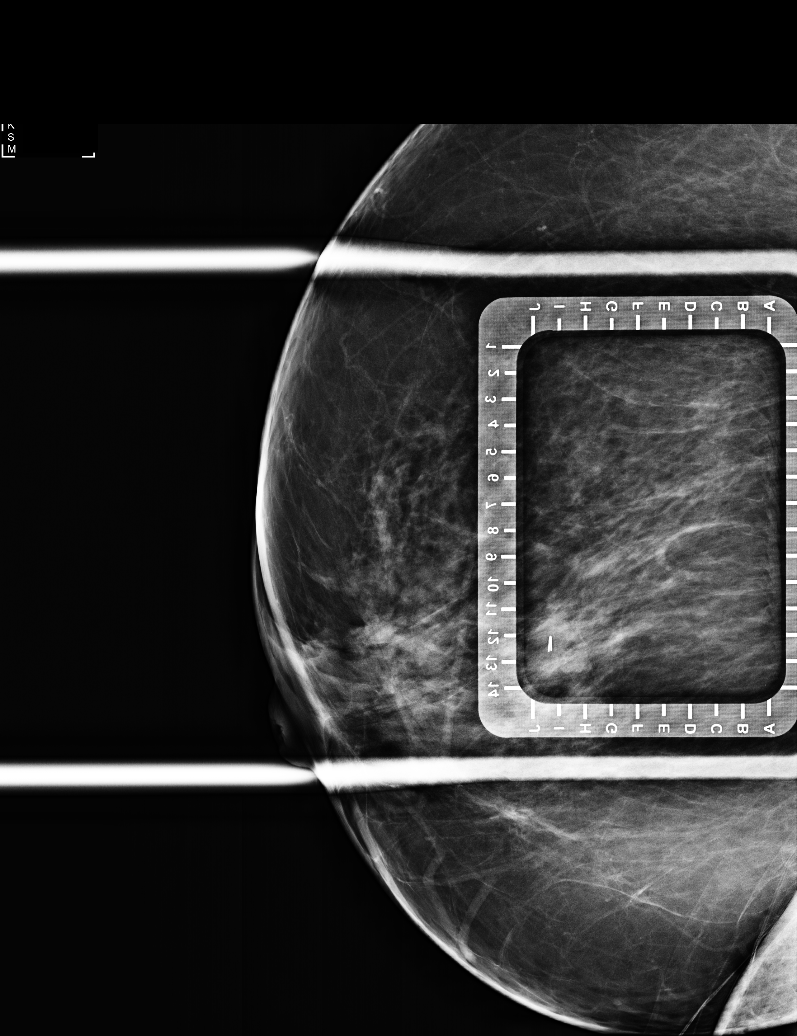

[R CC (3 of 3)]
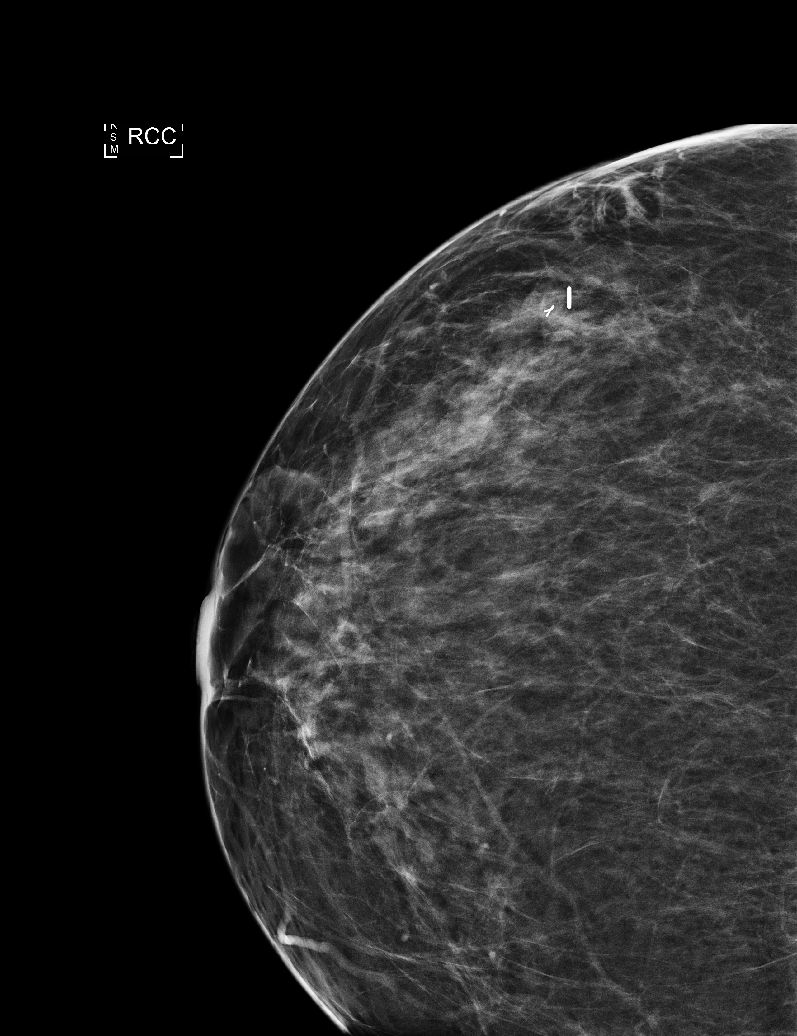

[6 of 6 positions shown; findings below may reference images not displayed]

FINDINGS: Patient presents for radioactive seed localization prior to right
lumpectomy. I met with the patient and we discussed the procedure of
seed localization including benefits and alternatives. We discussed
the high likelihood of a successful procedure. We discussed the
risks of the procedure including infection, bleeding, tissue injury
and further surgery. We discussed the low dose of radioactivity
involved in the procedure. Informed, written consent was given.

The usual time-out protocol was performed immediately prior to the
procedure.

Using mammographic guidance, sterile technique, 1% lidocaine and an
8-RSO radioactive seed, mass and ribbon clip within the outer right
breast was localized using a lateral approach. The follow-up
mammogram images confirm the seed in the expected location and were
marked for Dr. Ketty.

Follow-up survey of the patient confirms presence of the radioactive
seed.

Order number of 8-RSO seed:  [id number].

Total activity:  0.251 millicuries reference Date: 11/04/2018

The patient tolerated the procedure well and was released from the
[REDACTED]. She was given instructions regarding seed removal.
IMPRESSION: Radioactive seed localization right breast. No apparent
complications.
